# Patient Record
Sex: Female | Born: 1995 | Race: Black or African American | Hispanic: No | Marital: Single
Health system: Southern US, Community
[De-identification: ages and names within clinical notes are randomized; demographics above are authoritative.]

## PROBLEM LIST (undated history)

## (undated) DIAGNOSIS — N809 Endometriosis, unspecified: Secondary | ICD-10-CM

## (undated) HISTORY — PX: WISDOM TOOTH EXTRACTION: SHX21

## (undated) HISTORY — DX: Endometriosis, unspecified: N80.9

---

## 2017-03-23 DIAGNOSIS — J209 Acute bronchitis, unspecified: Secondary | ICD-10-CM | POA: Diagnosis not present

## 2018-02-09 DIAGNOSIS — M545 Low back pain: Secondary | ICD-10-CM | POA: Diagnosis not present

## 2018-02-09 DIAGNOSIS — G441 Vascular headache, not elsewhere classified: Secondary | ICD-10-CM | POA: Diagnosis not present

## 2018-02-09 DIAGNOSIS — N946 Dysmenorrhea, unspecified: Secondary | ICD-10-CM | POA: Diagnosis not present

## 2018-04-12 DIAGNOSIS — J209 Acute bronchitis, unspecified: Secondary | ICD-10-CM | POA: Diagnosis not present

## 2019-02-09 DIAGNOSIS — R5383 Other fatigue: Secondary | ICD-10-CM | POA: Diagnosis not present

## 2019-02-09 DIAGNOSIS — Z0000 Encounter for general adult medical examination without abnormal findings: Secondary | ICD-10-CM | POA: Diagnosis not present

## 2019-02-09 DIAGNOSIS — J029 Acute pharyngitis, unspecified: Secondary | ICD-10-CM | POA: Diagnosis not present

## 2019-03-12 DIAGNOSIS — J029 Acute pharyngitis, unspecified: Secondary | ICD-10-CM | POA: Diagnosis not present

## 2019-04-14 ENCOUNTER — Emergency Department (HOSPITAL_COMMUNITY)
Admission: EM | Admit: 2019-04-14 | Discharge: 2019-04-14 | Disposition: A | Discharge status: Home / Self Care | Payer: 59 | Attending: Emergency Medicine | Admitting: Emergency Medicine

## 2019-04-14 ENCOUNTER — Emergency Department (HOSPITAL_COMMUNITY): Payer: 59

## 2019-04-14 ENCOUNTER — Other Ambulatory Visit: Payer: Self-pay

## 2019-04-14 ENCOUNTER — Encounter (HOSPITAL_COMMUNITY): Payer: Self-pay | Admitting: Radiology

## 2019-04-14 DIAGNOSIS — J029 Acute pharyngitis, unspecified: Secondary | ICD-10-CM | POA: Diagnosis present

## 2019-04-14 DIAGNOSIS — J36 Peritonsillar abscess: Secondary | ICD-10-CM

## 2019-04-14 DIAGNOSIS — J020 Streptococcal pharyngitis: Secondary | ICD-10-CM | POA: Diagnosis not present

## 2019-04-14 DIAGNOSIS — L0291 Cutaneous abscess, unspecified: Secondary | ICD-10-CM

## 2019-04-14 LAB — BASIC METABOLIC PANEL
Anion gap: 9 (ref 5–15)
BUN: 5 mg/dL — ABNORMAL LOW (ref 6–20)
CO2: 23 mmol/L (ref 22–32)
Calcium: 8.9 mg/dL (ref 8.9–10.3)
Chloride: 107 mmol/L (ref 98–111)
Creatinine, Ser: 0.74 mg/dL (ref 0.44–1.00)
GFR calc Af Amer: >60 mL/min (ref >60)
GFR calc non Af Amer: >60 mL/min (ref >60)
Glucose, Bld: 97 mg/dL (ref 70–99)
Potassium: 3.5 mmol/L (ref 3.5–5.1)
Sodium: 139 mmol/L (ref 135–145)

## 2019-04-14 LAB — CBC WITH DIFFERENTIAL/PLATELET
Abs Immature Granulocytes: 0.02 10*3/uL (ref 0.00–0.07)
Basophils Absolute: 0.1 10*3/uL (ref 0.0–0.1)
Basophils Relative: 1 %
Eosinophils Absolute: 0.1 10*3/uL (ref 0.0–0.5)
Eosinophils Relative: 1 %
HCT: 36.5 % (ref 36.0–46.0)
Hemoglobin: 11.6 g/dL — ABNORMAL LOW (ref 12.0–15.0)
Immature Granulocytes: 0 %
Lymphocytes Relative: 11 %
Lymphs Abs: 1.1 10*3/uL (ref 0.7–4.0)
MCH: 26.6 pg (ref 26.0–34.0)
MCHC: 31.8 g/dL (ref 30.0–36.0)
MCV: 83.7 fL (ref 80.0–100.0)
Monocytes Absolute: 0.7 10*3/uL (ref 0.1–1.0)
Monocytes Relative: 8 %
Neutro Abs: 7.5 10*3/uL (ref 1.7–7.7)
Neutrophils Relative %: 79 %
Platelets: 324 10*3/uL (ref 150–400)
RBC: 4.36 MIL/uL (ref 3.87–5.11)
RDW: 13.2 % (ref 11.5–15.5)
WBC: 9.4 10*3/uL (ref 4.0–10.5)
nRBC: 0 % (ref 0.0–0.2)

## 2019-04-14 LAB — I-STAT BETA HCG BLOOD, ED (MC, WL, AP ONLY): I-stat hCG, quantitative: <5 m[IU]/mL (ref <5)

## 2019-04-14 IMAGING — CT CT NECK WITH CONTRAST
4 of 5 series · 15 of 33 positions shown, 17 images · IV contrast (omnipaque)
Comparison: None.

CLINICAL DATA: Sore throat for 4 weeks. Recent diagnosis of strep
throat without improvement on antibiotics.

EXAM:
CT NECK WITH CONTRAST
TECHNIQUE: Multidetector CT imaging of the neck was performed using the
standard protocol following the bolus administration of intravenous
contrast.
CONTRAST:  75mL OMNIPAQUE IOHEXOL 300 MG/ML  SOLN

[Series 3: neck 2.0 (person_name) (person_name) · axial · 0.52mm/px · z∈[-240,-114]mm · 4 of 107 slices shown, 5 images]
[im 22/107  soft-tissue]
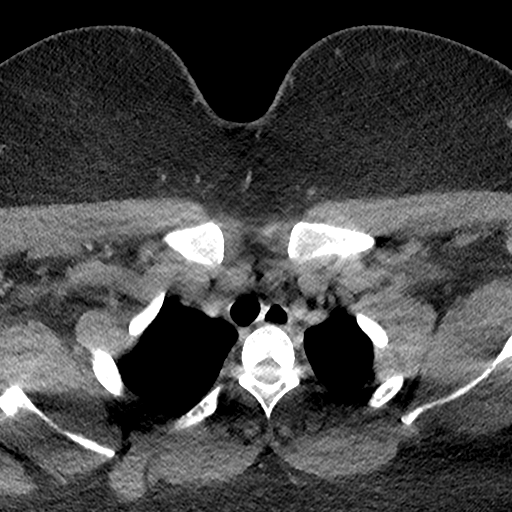
[im 22/107  bone]
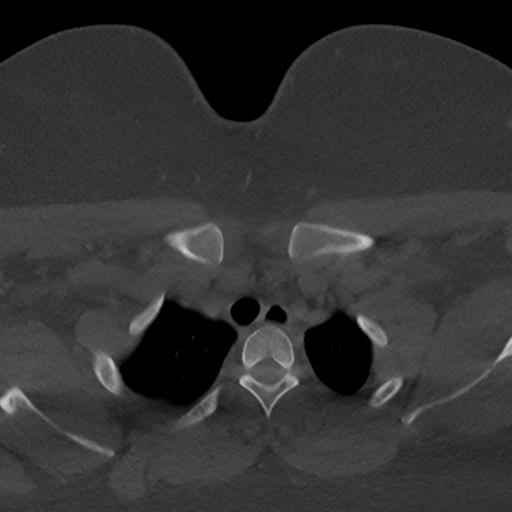
[im 43/107  bone]
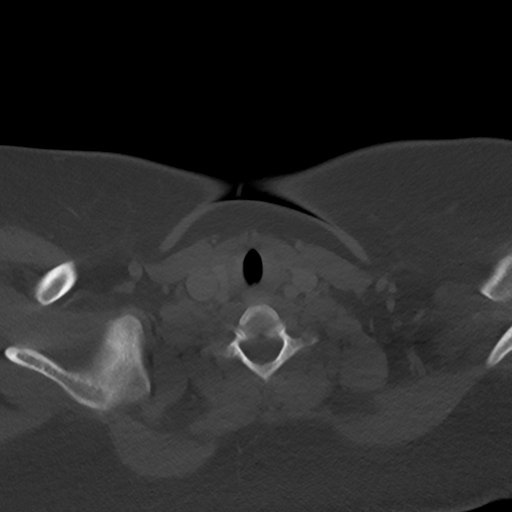
[im 64/107  bone]
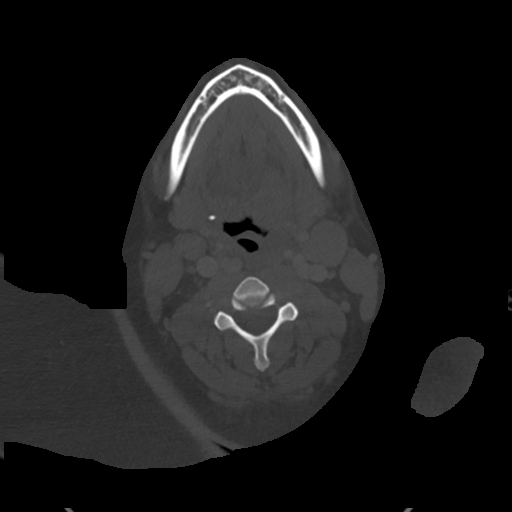
[im 85/107  bone]
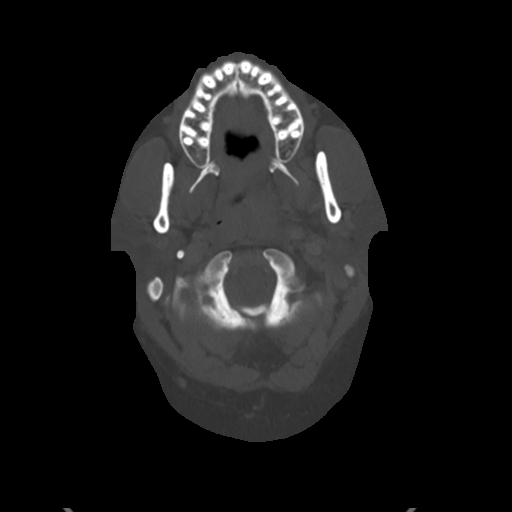

[Series 5: neck 2.0 st · sagittal · 0.45mm/px · 5 of 101 slices shown, 6 images (1 of 2)]
[im 34/101  bone]
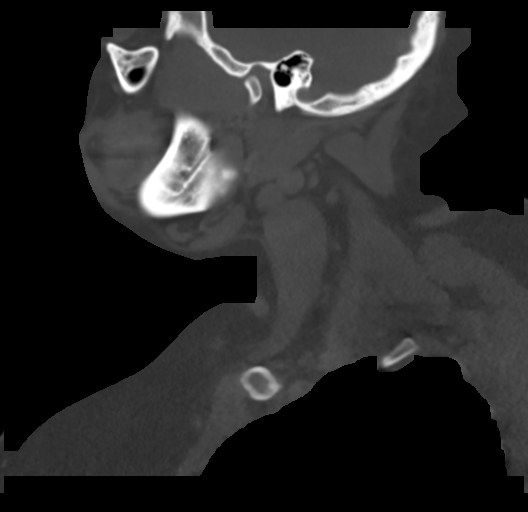
[im 42/101  bone]
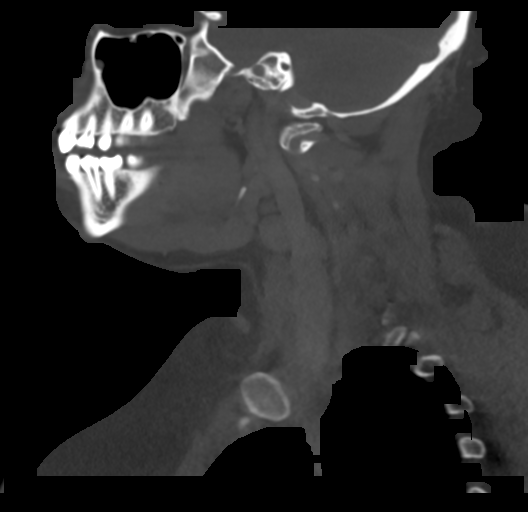
[im 51/101  soft-tissue]
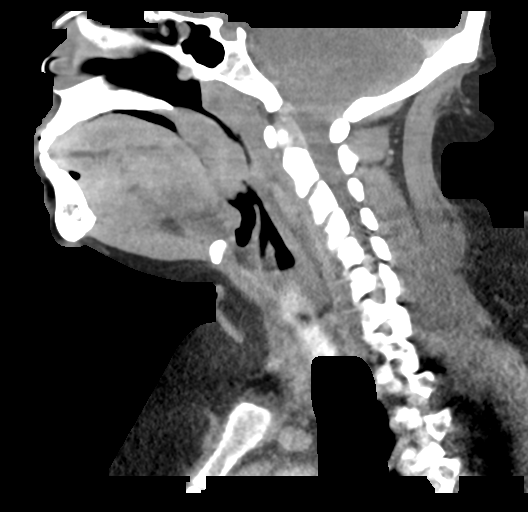
[im 51/101  bone]
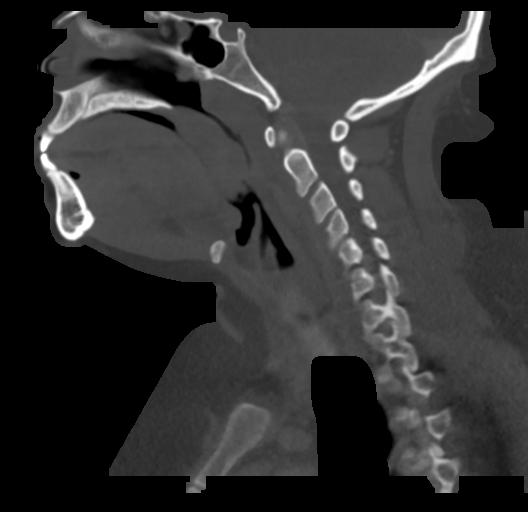
[im 59/101  bone]
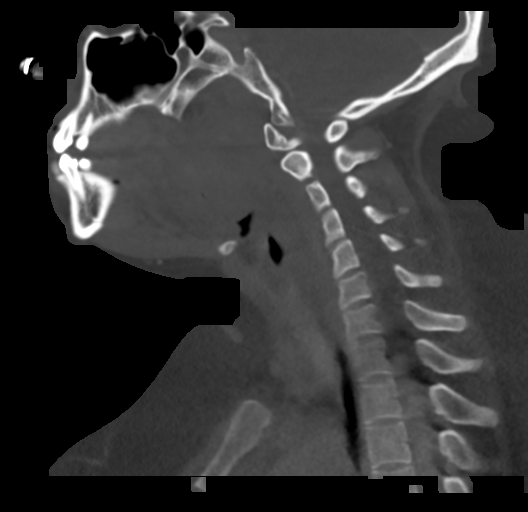
[im 67/101  bone]
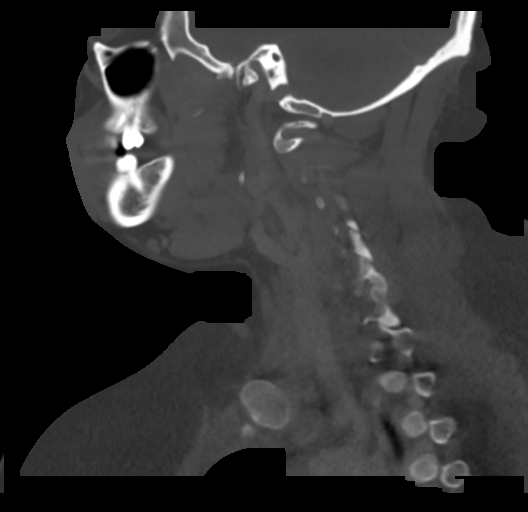

[Series 6: neck 2.0 st · coronal · 0.37mm/px · 3 of 109 slices shown (2 of 2)]
[im 22/109  bone]
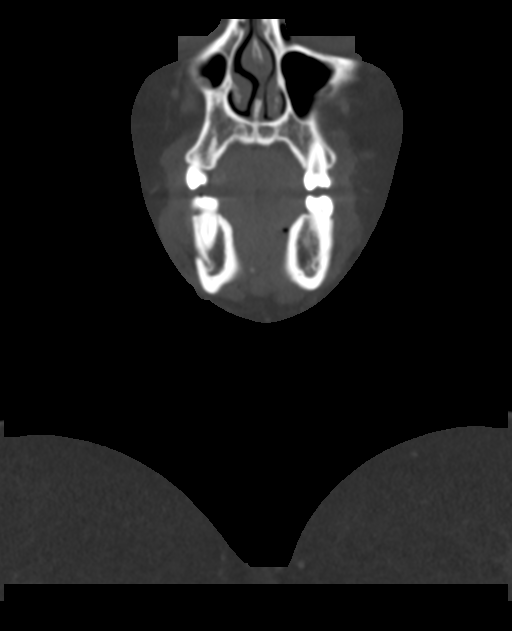
[im 44/109  bone]
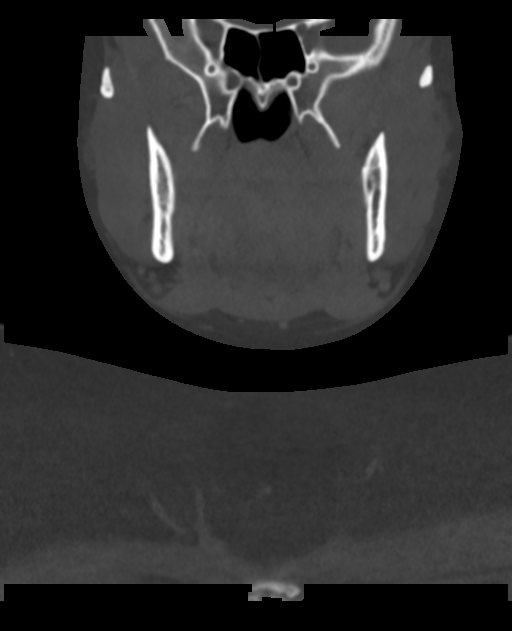
[im 65/109  bone]
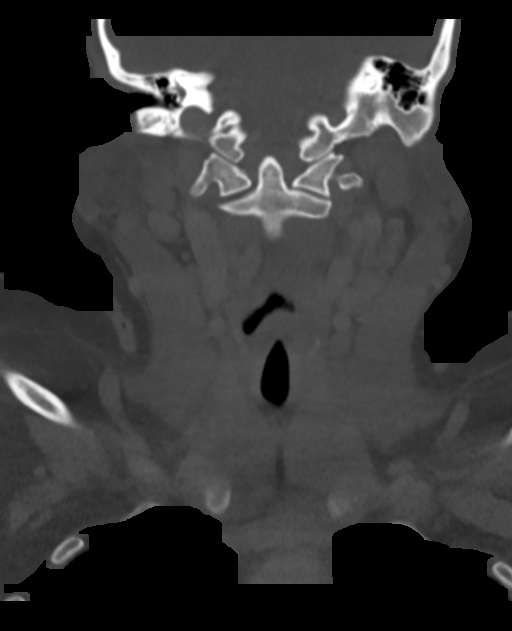

[Series 7: neck 2.0 st orthogonal · axial · 0.39mm/px · z∈[-240,-156]mm · 3 of 106 slices shown]
[im 22/106  bone]
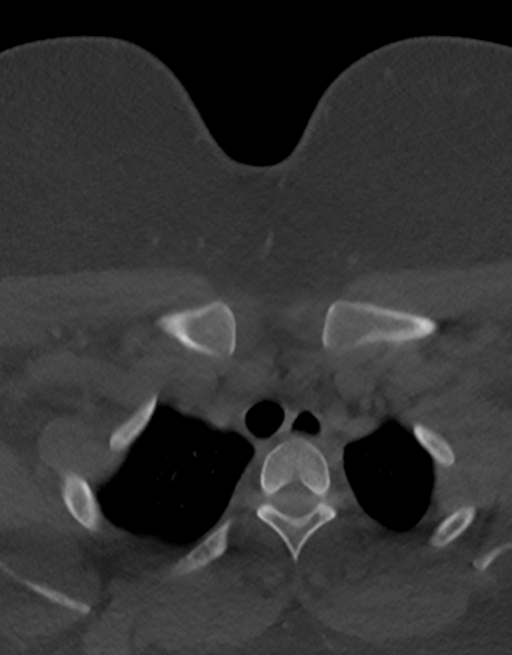
[im 43/106  bone]
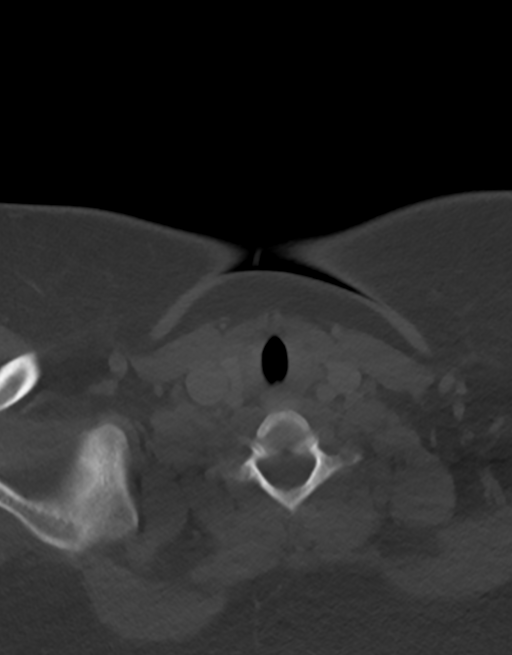
[im 64/106  bone]
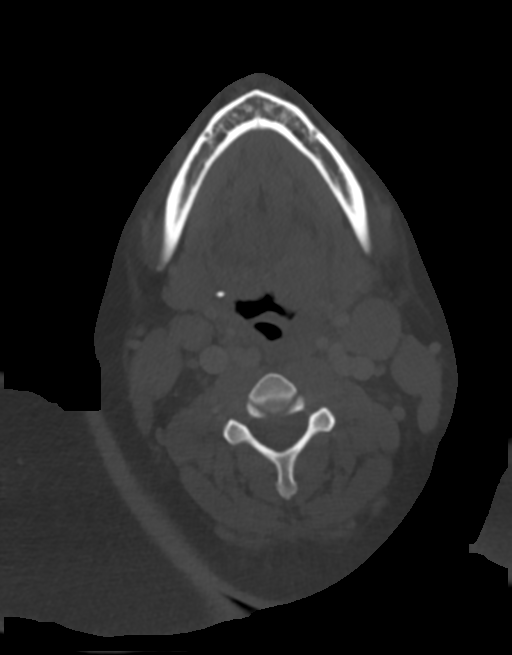

[15 of 33 positions shown; findings below may reference images not displayed]

FINDINGS: Pharynx and larynx: Palatine tonsils are enlarged bilaterally, left
greater than right. There is moderate prominence of the lingual
tonsils as well. An ill-defined area of hypoattenuation is present
within or adjacent to the left palatine tonsil measuring 1.1 x 2.0 x
1.8 cm. Adenoid tissue is enlarged without focal abscess. There is
some narrowing of the nasopharyngeal airway. Hypopharynx is clear.
Vocal cords are midline and symmetric. Trachea is unremarkable.

Salivary glands: Submandibular and parotid glands are within normal
limits bilaterally.

Thyroid: Normal

Lymph nodes: Enlarged cervical lymph nodes are present bilaterally.
These are likely reactive.

Vascular: No significant vascular disease is present.

Limited intracranial: Within normal limits

Visualized orbits: The globes and orbits are within normal limits.

Mastoids and visualized paranasal sinuses: The paranasal sinuses and
mastoid air cells are clear.

Skeleton: Vertebral body heights alignment are maintained. No focal
lytic or blastic lesions are present.

Upper chest: The lung apices are clear. Thoracic inlet is within
normal limits.
IMPRESSION: 1. Enlarged palatine tonsils and adenoid tissue compatible with
acute pharyngitis.
2. Hypodense area within the left palatine tonsil measuring 1.1 x
2.0 x 1.8 cm likely represents phlegmon with possible developing
abscess. No discrete drainable abscess is present at this time.
3. Reactive cervical adenopathy, left greater than right.

## 2019-04-14 MED ORDER — SODIUM CHLORIDE 0.9 % IV BOLUS
1000.0000 mL | Freq: Once | INTRAVENOUS | Status: AC
  Administered 2019-04-14: 12:00:00 1000 mL via INTRAVENOUS

## 2019-04-14 MED ORDER — DEXAMETHASONE SODIUM PHOSPHATE 10 MG/ML IJ SOLN
10.0000 mg | Freq: Once | INTRAMUSCULAR | Status: AC
  Administered 2019-04-14: 12:00:00 10 mg via INTRAVENOUS
  Filled 2019-04-14: qty 1

## 2019-04-14 MED ORDER — IOHEXOL 300 MG/ML  SOLN
75.0000 mL | Freq: Once | INTRAMUSCULAR | Status: AC | PRN
  Administered 2019-04-14: 75 mL via INTRAVENOUS

## 2019-04-14 MED ORDER — HYDROCODONE-ACETAMINOPHEN 5-325 MG PO TABS: 2.0000 | ORAL_TABLET | ORAL | 0 refills | Status: DC | PRN

## 2019-04-14 MED ORDER — CLINDAMYCIN PHOSPHATE 600 MG/50ML IV SOLN
600.0000 mg | Freq: Once | INTRAVENOUS | Status: AC
  Administered 2019-04-14: 600 mg via INTRAVENOUS
  Filled 2019-04-14: qty 50

## 2019-04-14 MED ORDER — KETOROLAC TROMETHAMINE 15 MG/ML IJ SOLN
15.0000 mg | Freq: Once | INTRAMUSCULAR | Status: AC
  Administered 2019-04-14: 15 mg via INTRAVENOUS
  Filled 2019-04-14: qty 1

## 2019-04-14 MED ORDER — CLINDAMYCIN HCL 300 MG PO CAPS: 300.0000 mg | ORAL_CAPSULE | Freq: Three times a day (TID) | ORAL | 0 refills | Status: AC

## 2019-04-14 NOTE — ED Provider Notes (Signed)
[facility] EMERGENCY DEPARTMENT Provider Note   CSN: [id number] Arrival date & time: 04/14/19  1053  History   Chief Complaint Chief Complaint  Patient presents with   Sore Throat    HPI Patricia Henderson is a 23 y.o. female with past medical history significant for strep pharyngitis presents for evaluation of sore throat.  Patient evaluated on Monday and diagnosed with strep pharyngitis by PCP.  Was given azithromycin.  Patient completed antibiotics yesterday 04/13/2019.  Patient presented to urgent care today with worsening sore throat and left-sided.  There was concern for PTA and was sent to ED for evaluation. Pain is rated a 8/10. Pain is located to bilateral sides of throat however worse on left. Denies fever, chill, drooling , dysphagia, trismus, neck pain, neck stiffness, facial swelling, rhinorrhea, HA, ear pain, oral swelling, CP, SOB, ABD pain, dysuria. Denies previous hx of PTA or RPA. Has not taken anything for her symptoms. Has been able to tolerate PO liquids however has pain with swallowing solid foods. Denies additional aggravating or alleviating factors. Symptoms constant since onset.  History obtained from patient. No interpretor as used.  No UR Sx or exposures to COVID positive patients.     HPI  History reviewed. No pertinent past medical history.  There are no active problems to display for this patient.   History reviewed   OB History   No obstetric history on file.      Home Medications    Prior to Admission medications   Medication Sig Start Date End Date Taking? Authorizing Provider  clindamycin (CLEOCIN) 300 MG capsule Take 1 capsule (300 mg total) by mouth 3 (three) times daily for 5 days. 04/14/19 04/19/19  Yobany Vroom A, PA-C    Family History No family history on file.  Social History Social History   Tobacco Use   Smoking status: Not on file  Substance Use Topics   Alcohol use: Not on file   Drug use: Not on  file     Allergies   Suprax [cefixime]   Review of Systems Review of Systems  Constitutional: Negative.   HENT: Positive for sore throat. Negative for congestion, dental problem, drooling, ear discharge, ear pain, facial swelling, mouth sores, postnasal drip, rhinorrhea, sinus pressure, sinus pain, sneezing, tinnitus, trouble swallowing and voice change.   Eyes: Negative.   Respiratory: Negative.   Cardiovascular: Negative.   Gastrointestinal: Negative.   Genitourinary: Negative.   Musculoskeletal: Negative.   Skin: Negative.   Neurological: Negative.   All other systems reviewed and are negative.    Physical Exam Updated Vital Signs BP 134/79 (BP Location: Right Arm)    Pulse 80    Temp 98.6 F (37 C) (Oral)    Resp 18    Ht 5\' 5"  (1.651 m)    Wt 119.3 kg    SpO2 99%    BMI 43.77 kg/m   Physical Exam Vitals signs and nursing note reviewed.  Constitutional:      General: She is not in acute distress.    Appearance: She is well-developed. She is not ill-appearing, toxic-appearing or diaphoretic.     Comments: Texting on phone on initial evaluation. No acute distress noted.  HENT:     Head: Normocephalic and atraumatic.     Comments: No facial swelling.  No submandibular swelling.  Jaw occlusion normal.  No trismus.  Salivary glands without tenderness or enlarged.    Right Ear: Tympanic membrane and ear canal normal. No drainage, swelling  or tenderness. No middle ear effusion. Tympanic membrane is not perforated, erythematous or retracted.     Left Ear: Tympanic membrane and ear canal normal. No drainage, swelling or tenderness.  No middle ear effusion. Tympanic membrane is not perforated, erythematous or retracted.     Nose: Nose normal. No congestion or rhinorrhea.     Mouth/Throat:     Lips: Pink.     Mouth: Mucous membranes are moist.     Dentition: No gingival swelling.     Pharynx: Oropharynx is clear. Uvula midline.     Comments: Mucous membranes moist.  No pooling  of secretions.  Uvula midline without deviation.  She does have some mildly asymmetric tonsils, left greater than right at 2+, right 1+.  Minimal exudate to bilateral tonsils.  No uvula swelling or exudate.  Sublingual area soft.  Her mucous membranes are moist.  She has no oral lesions. Eyes:     Pupils: Pupils are equal, round, and reactive to light.  Neck:     Musculoskeletal: Full passive range of motion without pain and normal range of motion.     Trachea: Phonation normal.     Comments: Neck stiffness or neck rigidity.  No meningismus.  She does have mild cervical lymphadenopathy.  No drooling, dysphasia or trismus.  Phonation normal. Cardiovascular:     Rate and Rhythm: Normal rate.     Pulses: Normal pulses.     Heart sounds: Normal heart sounds.  Pulmonary:     Effort: No respiratory distress.     Comments: Clear to auscultation bilaterally without wheeze, rhonchi or rales.  No accessory muscle usage.  No stridor.  Speaking full sentences without difficulty. Abdominal:     General: There is no distension.     Comments: Soft, nontender without rebound or guarding.  She has no splenomegaly.  Musculoskeletal: Normal range of motion.     Comments: Moves all 4 extremities without difficulty.  No lower extremity edema, erythema, ecchymosis or warmth.  Calves nontender bilaterally.  2+ DP pulses  Lymphadenopathy:     Cervical: Cervical adenopathy present.  Skin:    General: Skin is warm and dry.     Comments: Brisk capillary refill.  No rashes, lesions, vesicles or bulla.  Neurological:     Mental Status: She is alert.     Comments: Cranial nerves II through XII grossly intact.  Phonation normal.  No dysphasia.  No facial droop.  Patient ambulatory in department without difficulty.    ED Treatments / Results  Labs (all labs ordered are listed, but only abnormal results are displayed) Labs Reviewed  CBC WITH DIFFERENTIAL/PLATELET - Abnormal; Notable for the following components:       Result Value   Hemoglobin 11.6 (*)    All other components within normal limits  BASIC METABOLIC PANEL - Abnormal; Notable for the following components:   BUN 5 (*)    All other components within normal limits  I-STAT BETA HCG BLOOD, ED ([facility], [facility], [facility] ONLY)    EKG None  Radiology Ct Soft Tissue Neck W Contrast  Result Date: 04/14/2019 CLINICAL DATA:  Sore throat for 4 weeks. Recent diagnosis of strep throat without improvement on antibiotics. EXAM: CT NECK WITH CONTRAST TECHNIQUE: Multidetector CT imaging of the neck was performed using the standard protocol following the bolus administration of intravenous contrast. CONTRAST:  75mL OMNIPAQUE IOHEXOL 300 MG/ML  SOLN COMPARISON:  None. FINDINGS: Pharynx and larynx: Palatine tonsils are enlarged bilaterally, left greater than right. There  is moderate prominence of the lingual tonsils as well. An ill-defined area of hypoattenuation is present within or adjacent to the left palatine tonsil measuring 1.1 x 2.0 x 1.8 cm. Adenoid tissue is enlarged without focal abscess. There is some narrowing of the nasopharyngeal airway. Hypopharynx is clear. Vocal cords are midline and symmetric. Trachea is unremarkable. Salivary glands: Submandibular and parotid glands are within normal limits bilaterally. Thyroid: Normal Lymph nodes: Enlarged cervical lymph nodes are present bilaterally. These are likely reactive. Vascular: No significant vascular disease is present. Limited intracranial: Within normal limits Visualized orbits: The globes and orbits are within normal limits. Mastoids and visualized paranasal sinuses: The paranasal sinuses and mastoid air cells are clear. Skeleton: Vertebral body heights alignment are maintained. No focal lytic or blastic lesions are present. Upper chest: The lung apices are clear. Thoracic inlet is within normal limits. IMPRESSION: 1. Enlarged palatine tonsils and adenoid tissue compatible with acute pharyngitis. 2. Hypodense area  within the left palatine tonsil measuring 1.1 x 2.0 x 1.8 cm likely represents phlegmon with possible developing abscess. No discrete drainable abscess is present at this time. 3. Reactive cervical adenopathy, left greater than right. Electronically Signed   By: Marin Robertshristopher  Mattern M.D.   On: 04/14/2019 13:53    Procedures Procedures (including critical care time)  Medications Ordered in ED Medications  sodium chloride 0.9 % bolus 1,000 mL (1,000 mLs Intravenous New Bag/Given 04/14/19 1135)  clindamycin (CLEOCIN) IVPB 600 mg (0 mg Intravenous Stopped 04/14/19 1205)  ketorolac (TORADOL) 15 MG/ML injection 15 mg (15 mg Intravenous Given 04/14/19 1146)  dexamethasone (DECADRON) injection 10 mg (10 mg Intravenous Given 04/14/19 1146)  iohexol (OMNIPAQUE) 300 MG/ML solution 75 mL (75 mLs Intravenous Contrast Given 04/14/19 1254)     Initial Impression / Assessment and Plan / ED Course  I have reviewed the triage vital signs and the nursing notes.  Pertinent labs & imaging results that were available during my care of the patient were reviewed by me and considered in my medical decision making (see chart for details).  23 year old female appears otherwise well presents for evaluation of sore throat.  She is afebrile, nonseptic, non-ill-appearing.  Diagnosed with strep pharyngitis on Monday and recently completed prescription for azithromycin.  Seen by urgent care at [facility] today for worsening sore throat on left greater than right.  There was concern for possible peritonsillar abscess and was sent to ED for evaluation.  She has had no drooling, dysphasia or trismus.  Her phonation is normal.  She has no pooling of secretions.  She does have asymmetric tonsillar edema, 2+ on left, 1+ on right.  She has very minimal tonsillar exudate.  Her uvula is midline without deviation.  Mucous membranes are moist.  She has no evidence of respiratory distress.  Given concern for peritonsillar abscess will  obtain CT soft tissue neck to rule out PTA/RPA.  We will also give IVF and ABX for known strep infection. Strep pharyngitis positive by culture at [facility] PCP. Do not feel we need to repeat this at this time.  Personally reviewed notes and labs from UC and PCP that patient brought with her.  Labs-- CBC without leukocytosis, BMP without electrolyte renal or liver abnormality, HCG negative Imaging: CT neck-- Enlarged palatine tonsils and adenoid tissue compatible with acute pharyngitis. Hypodense area within the left palatine tonsil measuring 1.1 x 2.0 x 1.8 cm likely represents phlegmon with possible developing abscess. No discrete drainable abscess is present at this time. Reactive cervical adenopathy,  left greater than right. Meds--Clindamycin, Toradol, Decadron, IVF  1400: [facility]-- CT with Developing phlegmon with possible developing abscess which is not large enough to drain at this time, per Radiology. Patient given IV Clindamycin. Will dc home with PO Clindamycin and have patient follow up with ENT on Monday. Patient able to tolerate PO without difficulty, No evidence of respiratory distress, airway patent. Pain significant improvement with meds in ED. No drooling, dysphasia or trismus on reevluation.  She has no pooling of secretions.  Phonation without changes. Airway remain patent.  Patient is hemodynamically stable and in no acute distress.  Patient able to ambulate in department prior to ED.  Evaluation does not show acute pathology that would require ongoing or additional emergent interventions while in the emergency department or further inpatient treatment.  I have discussed the diagnosis with the patient and answered all questions.  Pain is been managed while in the emergency department and patient has no further complaints prior to discharge.  Patient is comfortable with plan discussed in room and is stable for discharge at this time.  I have discussed strict return precautions for returning to  the emergency department.  Patient was encouraged to follow-up with specialist referred to at discharge.     Patricia Henderson was evaluated in Emergency Department on 04/14/2019 for the symptoms described in the history of present illness. She was evaluated in the context of the global COVID-19 pandemic, which necessitated consideration that the patient might be at risk for infection with the SARS-CoV-2 virus that causes COVID-19. Institutional protocols and algorithms that pertain to the evaluation of patients at risk for COVID-19 are in a state of rapid change based on information released by regulatory bodies including the CDC and federal and state organizations. These policies and algorithms were followed during the patient's care in the ED. Final Clinical Impressions(s) / ED Diagnoses   Final diagnoses:  Pharyngitis due to Streptococcus species  Phlegmon  Peritonsillar abscess    ED Discharge Orders         Ordered    clindamycin (CLEOCIN) 300 MG capsule  3 times daily     04/14/19 1405           Santiana Glidden A, PA-C 04/14/19 1411    Tegeler, Canary Brim, MD 04/14/19 [phone]

## 2019-04-14 NOTE — ED Notes (Signed)
Patient verbalizes understanding of discharge instructions. Opportunity for questioning and answering were provided.  patient discharged from ED.  

## 2019-04-14 NOTE — Discharge Instructions (Signed)
Your CT scan showed a possible developing peritonsillar abscess. This is not large enough to drain at this time, per the Radiologist. Please follow up with ENT. Their contact information is listed on your discharge paperwork.  Call them on Monday.  Also given you antibiotics.  Please take as prescribed.  If you develop inability to swollow,

## 2019-04-14 NOTE — ED Triage Notes (Signed)
Pt c/o throat pain and swelling to the left tonsils , pt states she has been diagnosed with strep throat and was given abx , but still hasn't had much relief , pt states she is having difficultly swallowing ; denies any sob

## 2019-06-18 ENCOUNTER — Other Ambulatory Visit: Payer: Self-pay | Admitting: Obstetrics and Gynecology

## 2019-06-18 ENCOUNTER — Other Ambulatory Visit (HOSPITAL_COMMUNITY)
Admission: RE | Admit: 2019-06-18 | Discharge: 2019-06-18 | Disposition: A | Discharge status: Home / Self Care | Payer: 59 | Source: Ambulatory Visit | Attending: Obstetrics and Gynecology | Admitting: Obstetrics and Gynecology

## 2019-06-18 DIAGNOSIS — Z01419 Encounter for gynecological examination (general) (routine) without abnormal findings: Secondary | ICD-10-CM | POA: Insufficient documentation

## 2019-06-19 LAB — CYTOLOGY - PAP: Diagnosis: NEGATIVE

## 2020-03-15 NOTE — Progress Notes (Signed)
 [place] Department of Health notified of results per regulations

## 2021-03-04 ENCOUNTER — Other Ambulatory Visit: Payer: Self-pay | Admitting: Orthopedic Surgery

## 2021-03-04 DIAGNOSIS — M5416 Radiculopathy, lumbar region: Secondary | ICD-10-CM

## 2021-03-04 DIAGNOSIS — M545 Low back pain, unspecified: Secondary | ICD-10-CM

## 2021-03-08 ENCOUNTER — Ambulatory Visit
Admission: RE | Admit: 2021-03-08 | Discharge: 2021-03-08 | Disposition: A | Discharge status: Home / Self Care | Payer: 59 | Source: Ambulatory Visit | Attending: Orthopedic Surgery | Admitting: Orthopedic Surgery

## 2021-03-08 ENCOUNTER — Other Ambulatory Visit: Payer: Self-pay

## 2021-03-08 DIAGNOSIS — M5416 Radiculopathy, lumbar region: Secondary | ICD-10-CM

## 2021-03-08 DIAGNOSIS — M545 Low back pain, unspecified: Secondary | ICD-10-CM
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
## 2021-05-28 ENCOUNTER — Other Ambulatory Visit: Payer: Self-pay | Admitting: Family Medicine

## 2021-05-28 ENCOUNTER — Other Ambulatory Visit: Payer: Self-pay

## 2021-05-28 ENCOUNTER — Ambulatory Visit
Admission: RE | Admit: 2021-05-28 | Discharge: 2021-05-28 | Disposition: A | Discharge status: Home / Self Care | Payer: 59 | Source: Ambulatory Visit | Attending: Family Medicine | Admitting: Family Medicine

## 2021-05-28 DIAGNOSIS — R059 Cough, unspecified: Secondary | ICD-10-CM

## 2021-05-28 IMAGING — CR DG CHEST 2V
2 series · 2 of 2 positions shown · non-contrast
Comparison: None.

CLINICAL DATA: 24-year-old female with cough

EXAM:
CHEST - 2 VIEW

[w chest pa]
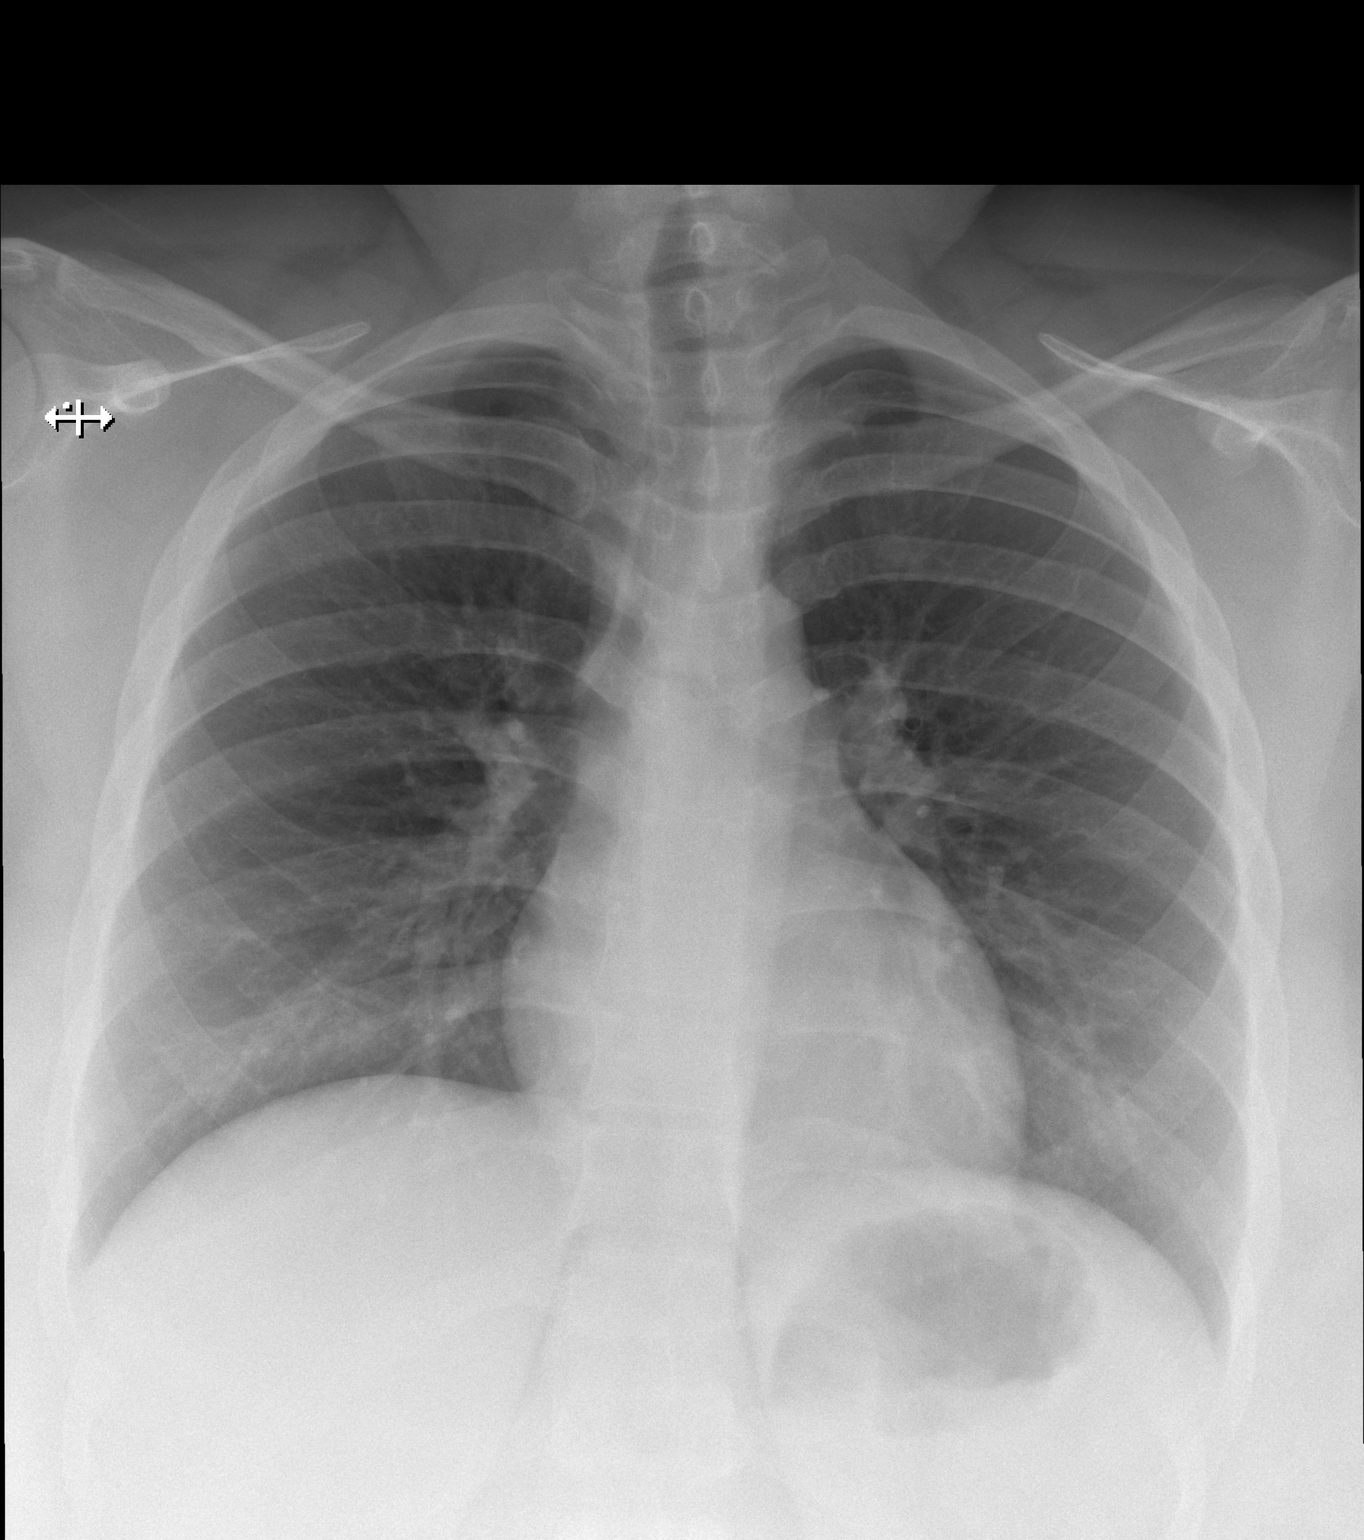

[w chest lat]
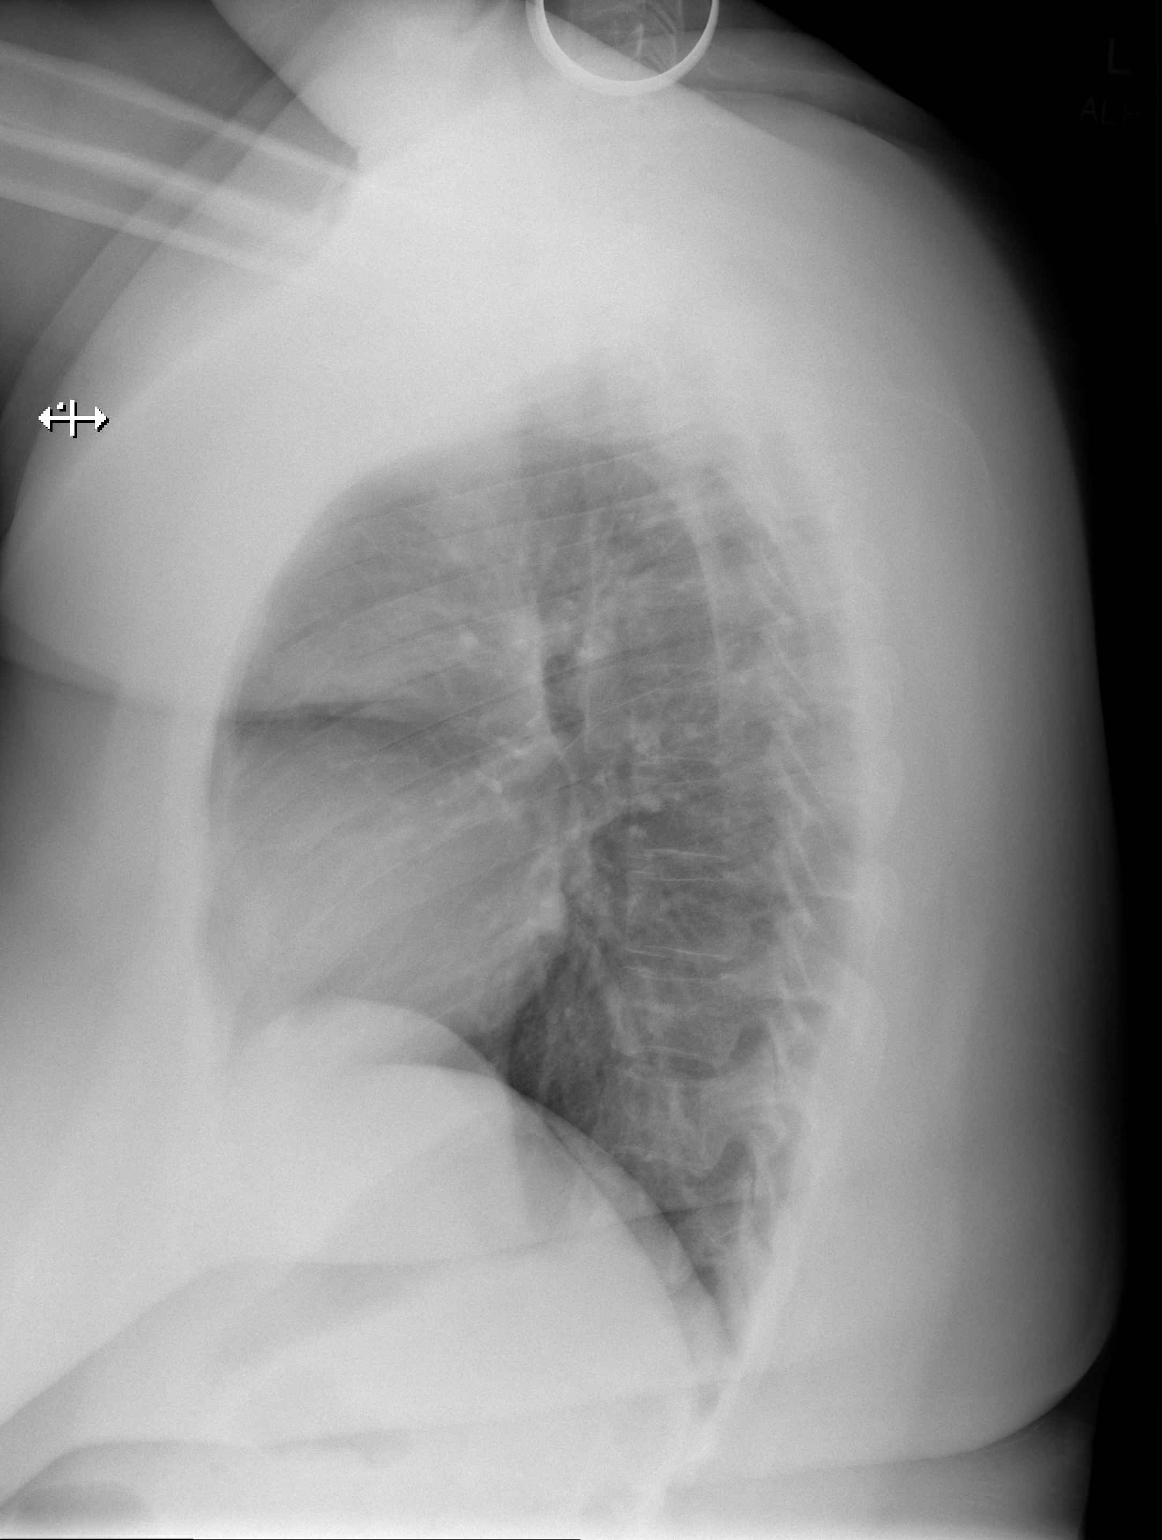

[2 of 2 positions shown; findings below may reference images not displayed]

FINDINGS: The heart size and mediastinal contours are within normal limits.
Both lungs are clear. The visualized skeletal structures are
unremarkable.
IMPRESSION: No active cardiopulmonary disease.

## 2021-09-16 ENCOUNTER — Other Ambulatory Visit: Payer: Self-pay

## 2021-09-16 ENCOUNTER — Encounter: Payer: Self-pay | Admitting: Internal Medicine

## 2021-09-16 ENCOUNTER — Ambulatory Visit: Payer: 59 | Admitting: Internal Medicine

## 2021-09-16 VITALS — BP 100/70 | HR 124 | Temp 98.7°F | Ht 64.5 in | Wt 307.0 lb

## 2021-09-16 DIAGNOSIS — J310 Chronic rhinitis: Secondary | ICD-10-CM

## 2021-09-16 DIAGNOSIS — R053 Chronic cough: Secondary | ICD-10-CM

## 2021-09-16 DIAGNOSIS — R0602 Shortness of breath: Secondary | ICD-10-CM | POA: Diagnosis not present

## 2021-09-16 MED ORDER — FLUTICASONE PROPIONATE 50 MCG/ACT NA SUSP: 1.0000 | Freq: Every day | NASAL | 2 refills | Status: DC

## 2021-09-16 MED ORDER — PANTOPRAZOLE SODIUM 40 MG PO TBEC: 40.0000 mg | DELAYED_RELEASE_TABLET | Freq: Every day | ORAL | 3 refills | Status: AC

## 2021-09-16 NOTE — Patient Instructions (Addendum)
Please schedule follow up scheduled with myself in 2 months.    Before your next visit I would like you to have:  Full set of PFTs - 45 minutes (next available)  Flonase - 1 spray on each side of your nose twice a day for first week, then 1 spray on each side.   Instructions for use: If you also use a saline nasal spray or rinse, use that first. Position the head with the chin slightly tucked. Use the right hand to spray into the left nostril and the right hand to spray into the left nostril.   Point the bottle away from the septum of your nose (cartilage that divides the two sides of your nose).  Hold the nostril closed on the opposite side from where you will spray Spray once and gently sniff to pull the medicine into the higher parts of your nose.  Don't sniff too hard as the medicine will drain down the back of your throat instead. Repeat with a second spray on the same side if prescribed. Repeat on the other side of your nose.    What is GERD? Gastroesophageal reflux disease (GERD) is gastroesophageal reflux diseasewhich occurs when the lower esophageal sphincter (LES) opens spontaneously, for varying periods of time, or does not close properly and stomach contents rise up into the esophagus. GER is also called acid reflux or acid regurgitation, because digestive juices--called acids--rise up with the food. The esophagus is the tube that carries food from the mouth to the stomach. The LES is a ring of muscle at the bottom of the esophagus that acts like a valve between the esophagus and stomach.  When acid reflux occurs, food or fluid can be tasted in the back of the mouth. When refluxed stomach acid touches the lining of the esophagus it may cause a burning sensation in the chest or throat called heartburn or acid indigestion. Occasional reflux is common. Persistent reflux that occurs more than twice a week is considered GERD, and it can eventually lead to more serious health problems.  People of all ages can have GERD. Studies have shown that GERD may worsen or contribute to asthma, chronic cough, and pulmonary fibrosis.   What are the symptoms of GERD? The main symptom of GERD in adults is frequent heartburn, also called acid indigestion--burning-type pain in the lower part of the mid-chest, behind the breast bone, and in the mid-abdomen.  Not all reflux is acidic in nature, and many patients don't have heart burn at all. Sometimes it feels like a cough (either dry or with mucus), choking sensation, asthma, shortness of breath, waking up at night, frequent throat clearing, or trouble swallowing.    What causes GERD? The reason some people develop GERD is still unclear. However, research shows that in people with GERD, the LES relaxes while the rest of the esophagus is working. Anatomical abnormalities such as a hiatal hernia may also contribute to GERD. A hiatal hernia occurs when the upper part of the stomach and the LES move above the diaphragm, the muscle wall that separates the stomach from the chest. Normally, the diaphragm helps the LES keep acid from rising up into the esophagus. When a hiatal hernia is present, acid reflux can occur more easily. A hiatal hernia can occur in people of any age and is most often a normal finding in otherwise healthy people over age 54. Most of the time, a hiatal hernia produces no symptoms.   Other factors that may contribute  to GERD include - Obesity or recent weight gain - Pregnancy  - Smoking  - Diet - Certain medications  Common foods that can worsen reflux symptoms include: - carbonated beverages - artificial sweeteners - citrus fruits  - chocolate  - drinks with caffeine or alcohol  - fatty and fried foods  - garlic and onions  - mint flavorings  - spicy foods  - tomato-based foods, like spaghetti sauce, salsa, chili, and pizza   Lifestyle Changes If you smoke, stop.  Avoid foods and beverages that worsen symptoms (see  above.) Lose weight if needed.  Eat small, frequent meals.  Wear loose-fitting clothes.  Avoid lying down for 3 hours after a meal.  Raise the head of your bed 6 to 8 inches by securing wood blocks under the bedposts. Just using extra pillows will not help, but using a wedge-shaped pillow may be helpful.  Medications  H2 blockers, such as cimetidine (Tagamet HB), famotidine (Pepcid AC), nizatidine (Axid AR), and ranitidine (Zantac 75), decrease acid production. They are available in prescription strength and over-the-counter strength. These drugs provide short-term relief and are effective for about half of those who have GERD symptoms.  Proton pump inhibitors include omeprazole (Prilosec, Zegerid), lansoprazole (Prevacid), pantoprazole (Protonix), rabeprazole (Aciphex), and esomeprazole (Nexium), which are available by prescription. Prilosec is also available in over-the-counter strength. Proton pump inhibitors are more effective than H2 blockers and can relieve symptoms and heal the esophageal lining in almost everyone who has GERD.  Because drugs work in different ways, combinations of medications may help control symptoms. People who get heartburn after eating may take both antacids and H2 blockers. The antacids work first to neutralize the acid in the stomach, and then the H2 blockers act on acid production. By the time the antacid stops working, the H2 blocker will have stopped acid production. Your health care provider is the best source of information about how to use medications for GERD.   Points to Remember 1. You can have GERD without having heartburn. Your symptoms could include a dry cough, asthma symptoms, or trouble swallowing.  2. Taking medications daily as prescribed is important in controlling you symptoms.  Sometimes it can take up to 8 weeks to fully achieve the effects of the medications prescribed.  3. Coughing related to GERD can be difficult to treat and is very  frustrating!  However, it is important to stick with these medications and lifestyle modifications before pursuing more aggressive or invasive test and treatments.
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
## 2021-11-16 ENCOUNTER — Ambulatory Visit: Payer: 59 | Admitting: Internal Medicine

## 2021-12-12 ENCOUNTER — Other Ambulatory Visit: Payer: Self-pay | Admitting: Internal Medicine

## 2021-12-15 DIAGNOSIS — Z3041 Encounter for surveillance of contraceptive pills: Secondary | ICD-10-CM | POA: Diagnosis not present

## 2022-03-04 ENCOUNTER — Other Ambulatory Visit: Payer: Self-pay | Admitting: Physician Assistant

## 2022-03-04 DIAGNOSIS — R2232 Localized swelling, mass and lump, left upper limb: Secondary | ICD-10-CM

## 2022-03-04 DIAGNOSIS — Z13220 Encounter for screening for lipoid disorders: Secondary | ICD-10-CM | POA: Diagnosis not present

## 2022-03-04 DIAGNOSIS — Z23 Encounter for immunization: Secondary | ICD-10-CM | POA: Diagnosis not present

## 2022-03-04 DIAGNOSIS — F411 Generalized anxiety disorder: Secondary | ICD-10-CM | POA: Diagnosis not present

## 2022-03-04 DIAGNOSIS — Z0000 Encounter for general adult medical examination without abnormal findings: Secondary | ICD-10-CM | POA: Diagnosis not present

## 2022-03-05 ENCOUNTER — Ambulatory Visit
Admission: RE | Admit: 2022-03-05 | Discharge: 2022-03-05 | Disposition: A | Discharge status: Home / Self Care | Payer: BC Managed Care – PPO | Source: Ambulatory Visit | Attending: Physician Assistant | Admitting: Physician Assistant

## 2022-03-05 DIAGNOSIS — R2232 Localized swelling, mass and lump, left upper limb: Secondary | ICD-10-CM

## 2022-03-05 DIAGNOSIS — M7989 Other specified soft tissue disorders: Secondary | ICD-10-CM | POA: Diagnosis not present

## 2022-03-05 IMAGING — US US EXTREM UP*L* LTD
1 series · 14 of 18 positions shown · non-contrast
Comparison: None.

CLINICAL DATA: Two forearm lesions.

EXAM:
ULTRASOUND left UPPER EXTREMITY LIMITED
TECHNIQUE: Ultrasound examination of the upper extremity soft tissues was
performed in the area of clinical concern.

[Series 1: us extrem up*left* ltd · 0.05mm/px · 18 acquisitions, 14 frames shown]
[im 1/18]
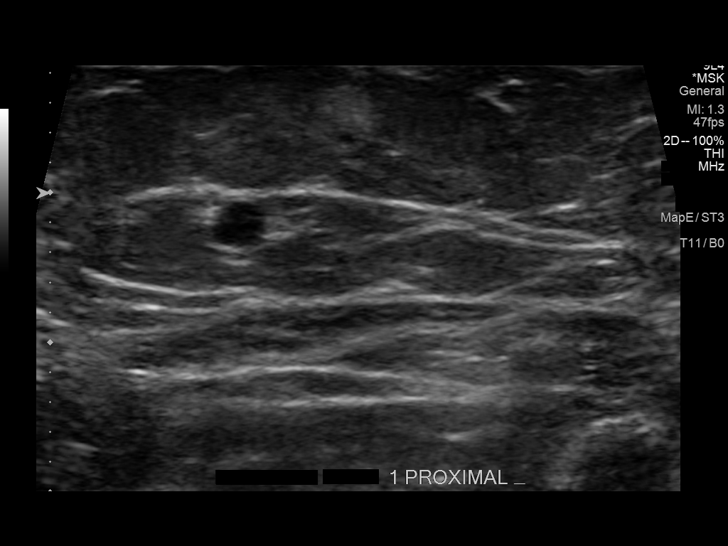
[im 2/18]
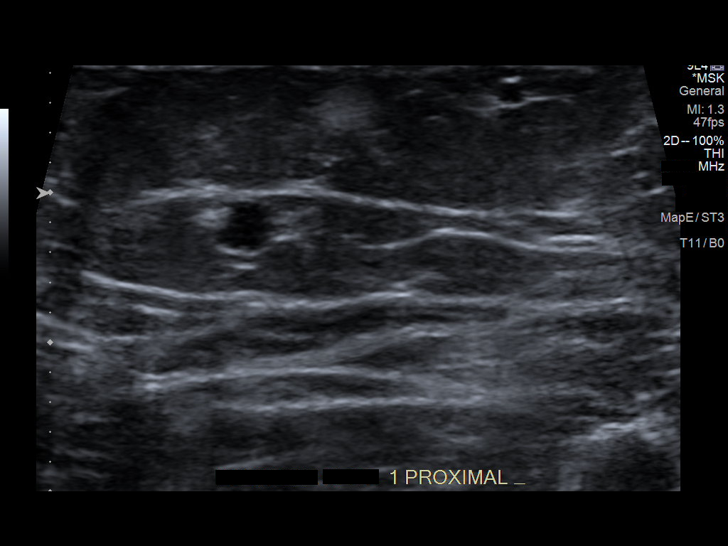
[im 4/18]
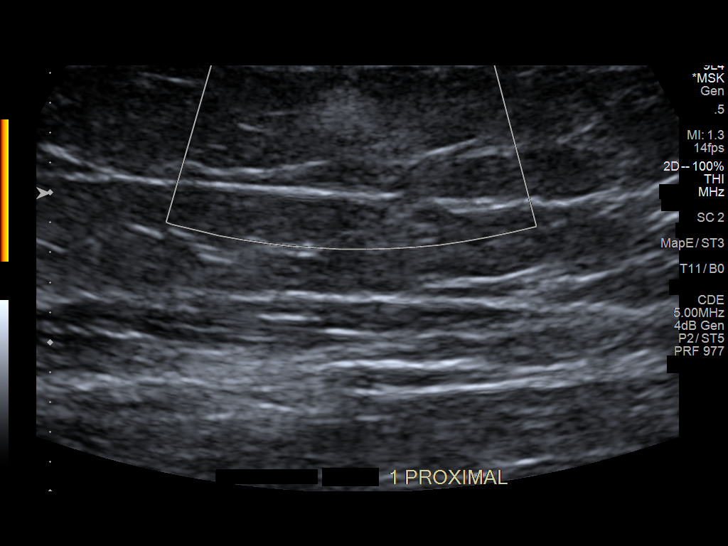
[im 5/18]
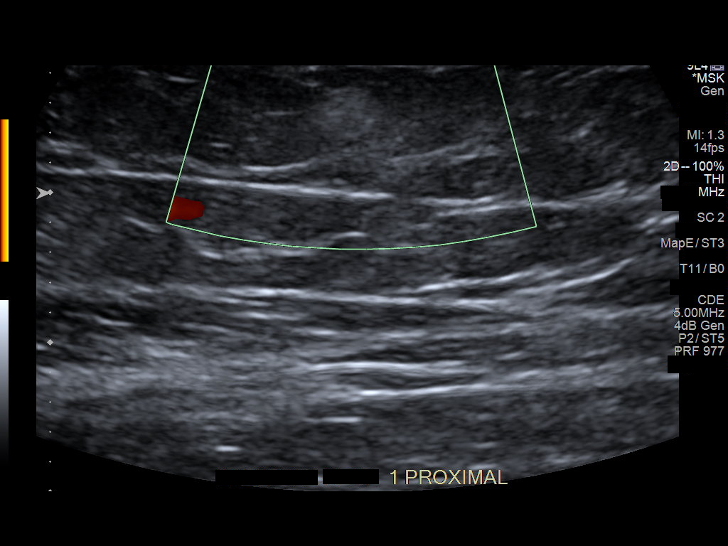
[im 6/18]
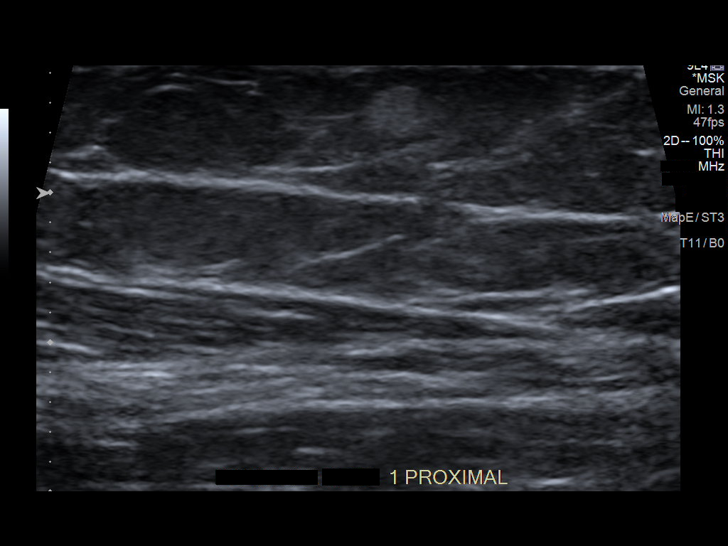
[im 8/18]
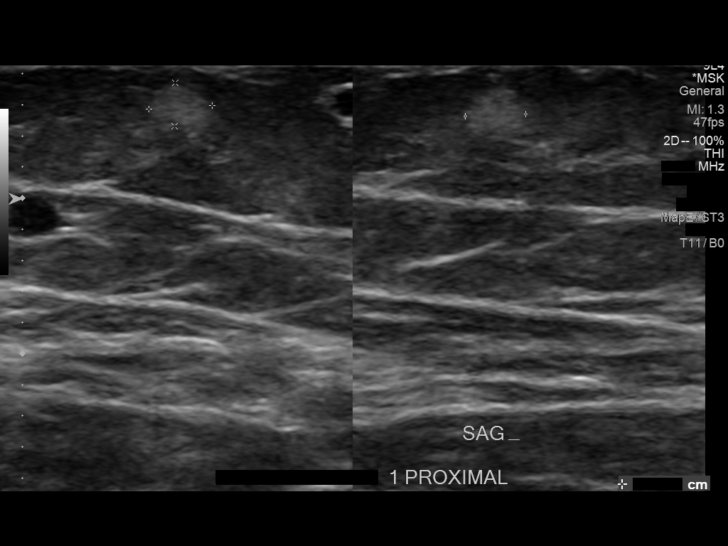
[im 9/18]
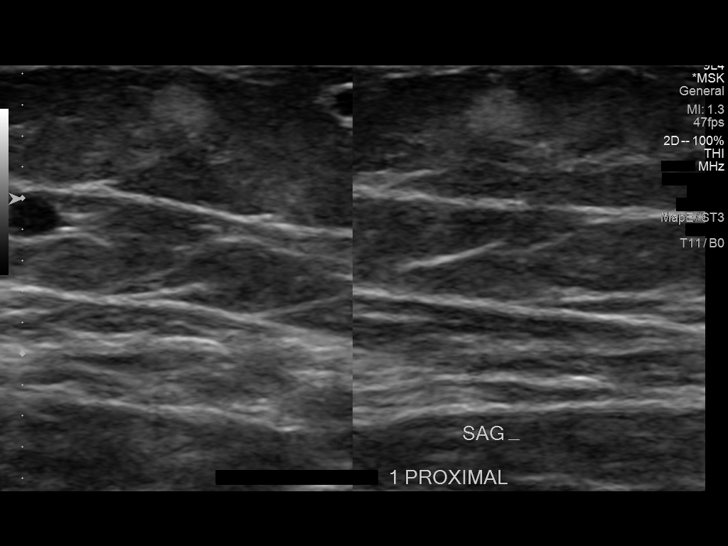
[im 10/18]
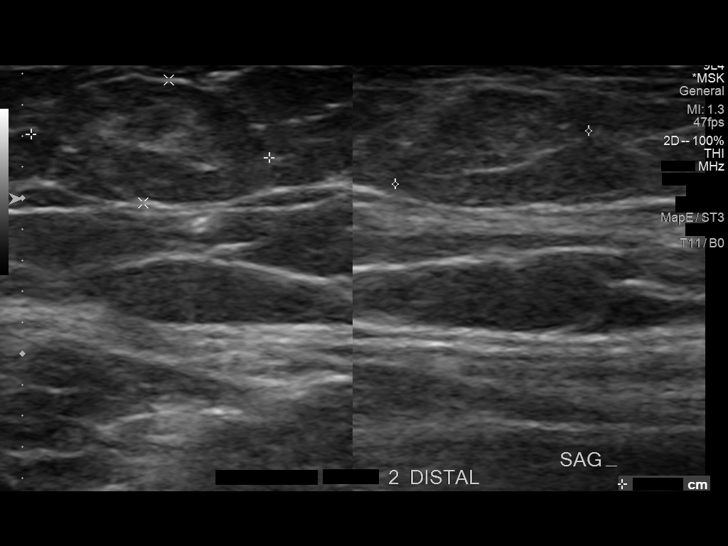
[im 11/18]
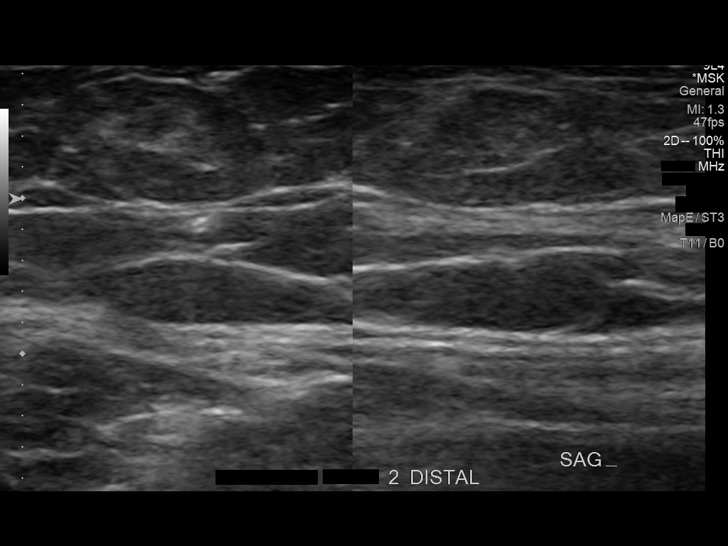
[im 13/18]
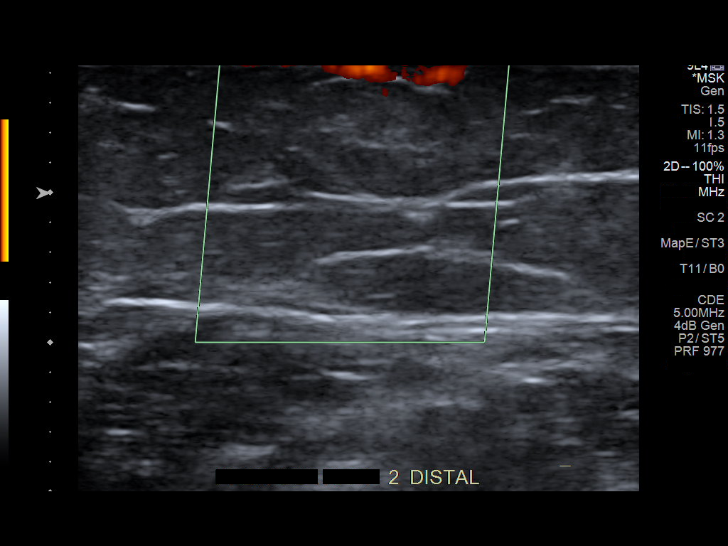
[im 14/18]
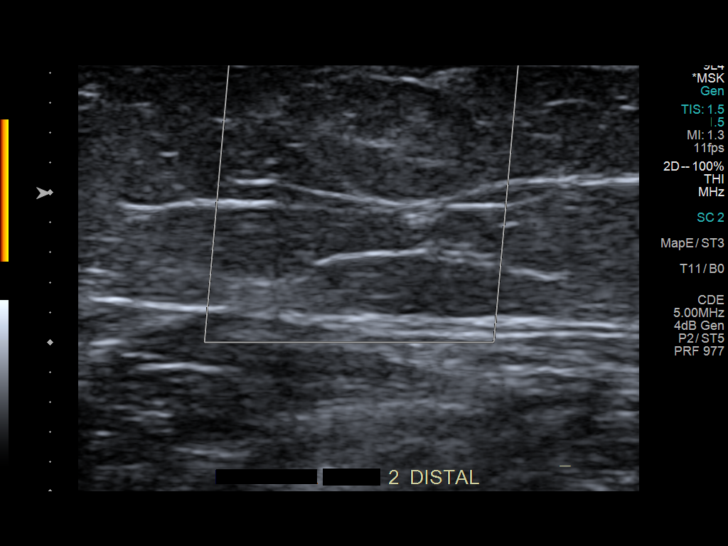
[im 15/18]
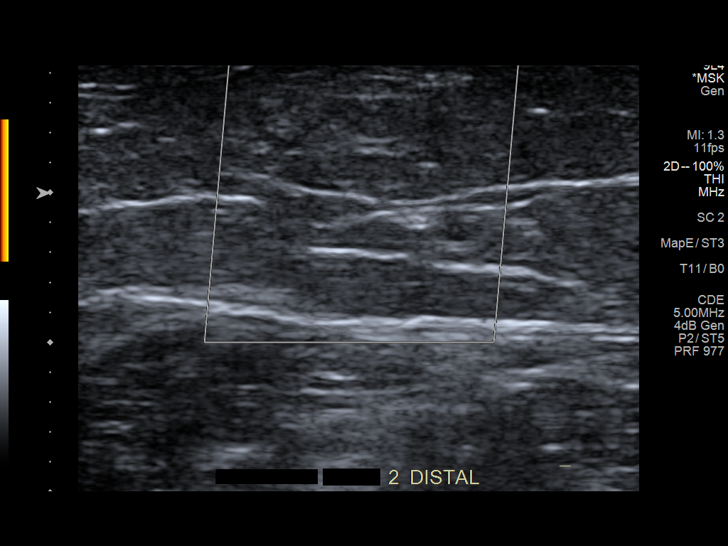
[im 17/18]
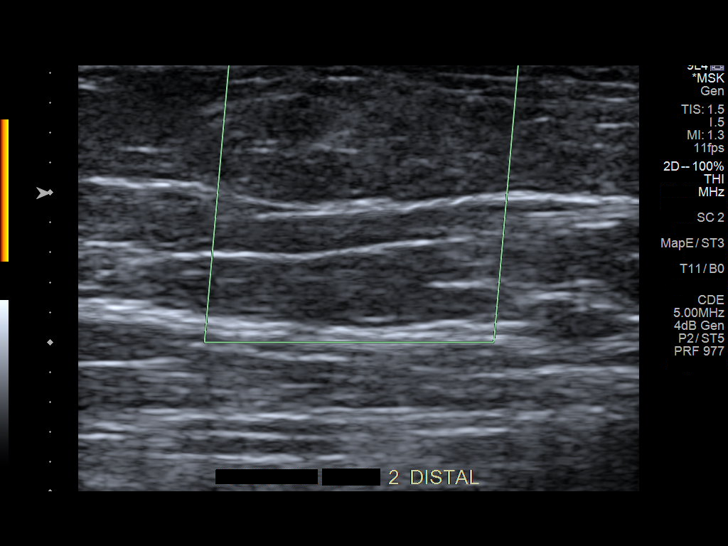
[im 18/18]
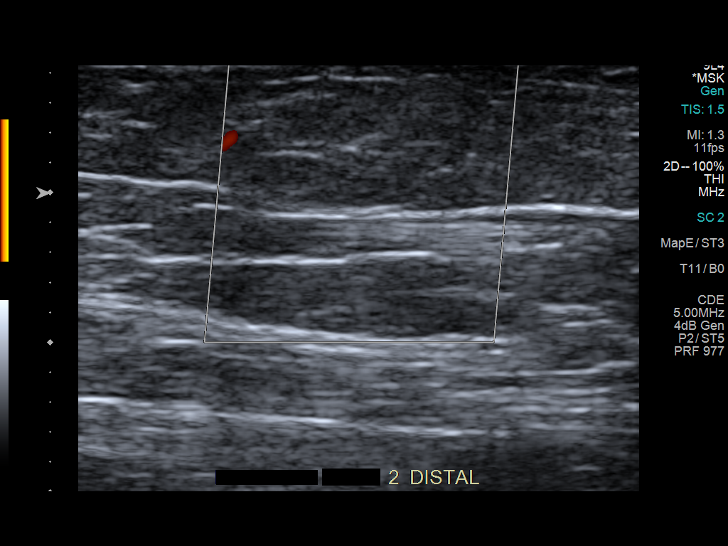

[14 of 18 positions shown; findings below may reference images not displayed]

FINDINGS: The more proximal palpable lesion corresponds to a small
homogeneously echogenic structure measuring approximately 4 x 3 x 4
cm. This is much more echogenic than the surrounding fat and is
unlikely a lipoma.

The second more distal forearm lesion measures 1.5 x 0.8 x 1.3 cm
and has very similar echogenicity as the surrounding subcutaneous
fat. This is most likely a benign lipoma. Neither lesion
demonstrates internal blood flow.
IMPRESSION: Indeterminate 4 mm soft tissue lesion in the subcutaneous fat of the
left forearm. The other more distal lesion appears to be a benign
lipoma. Recommend MR imaging without and with contrast for further
evaluation.

## 2022-03-09 ENCOUNTER — Other Ambulatory Visit: Payer: Self-pay | Admitting: Family Medicine

## 2022-03-09 DIAGNOSIS — R2232 Localized swelling, mass and lump, left upper limb: Secondary | ICD-10-CM

## 2022-03-16 DIAGNOSIS — N946 Dysmenorrhea, unspecified: Secondary | ICD-10-CM | POA: Diagnosis not present

## 2022-03-16 DIAGNOSIS — Z3041 Encounter for surveillance of contraceptive pills: Secondary | ICD-10-CM | POA: Diagnosis not present

## 2022-03-31 ENCOUNTER — Ambulatory Visit
Admission: RE | Admit: 2022-03-31 | Discharge: 2022-03-31 | Disposition: A | Discharge status: Home / Self Care | Payer: BC Managed Care – PPO | Source: Ambulatory Visit | Attending: Family Medicine | Admitting: Family Medicine

## 2022-03-31 ENCOUNTER — Other Ambulatory Visit: Payer: BC Managed Care – PPO

## 2022-03-31 DIAGNOSIS — R2232 Localized swelling, mass and lump, left upper limb: Secondary | ICD-10-CM

## 2022-03-31 IMAGING — MR MR FOREARM*L* WO/W CM
9 series · 40 of 40 positions shown · IV contrast (multihance)
Comparison: None Available.

CLINICAL DATA: Mass in the mid-distal forearm. Symptoms for 1 year.
No known injury.

EXAM:
MRI OF THE LEFT FOREARM WITHOUT AND WITH CONTRAST
TECHNIQUE: Multiplanar, multisequence MR imaging of the left forearm was
performed before and after the administration of intravenous
contrast.
CONTRAST:  20mL MULTIHANCE GADOBENATE DIMEGLUMINE 529 MG/ML IV SOLN

[Series 6: T1 · axial · left · 5.0mm · 0.44mm/px · z∈[+71,+332]mm · 5 of 45 slices shown (1 of 3)]
[im 1/45]
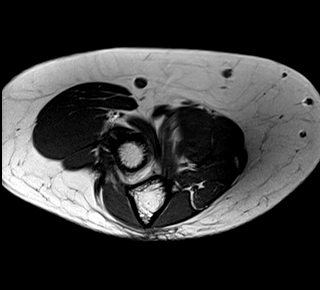
[im 12/45]
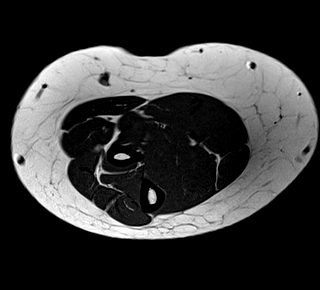
[im 23/45]
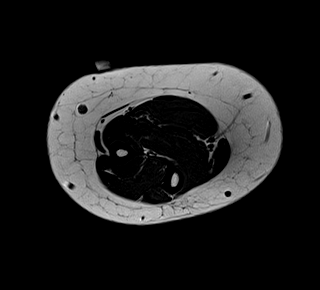
[im 34/45]
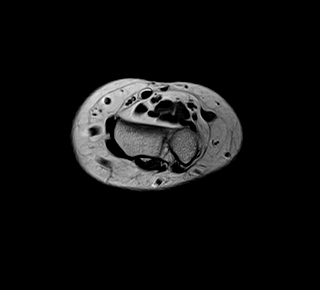
[im 45/45]
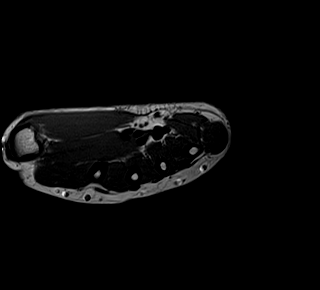

[Series 8: T1 · coronal · left · 4.0mm · 0.83mm/px · 3 of 23 slices shown (2 of 3)]
[im 1/23]
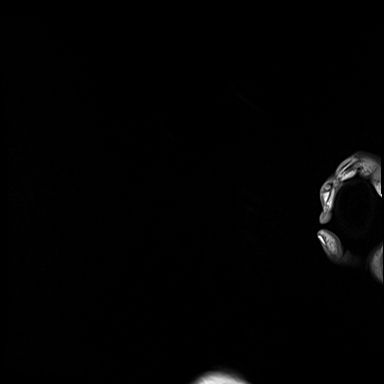
[im 12/23]
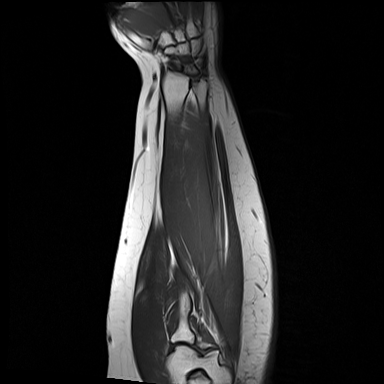
[im 23/23]
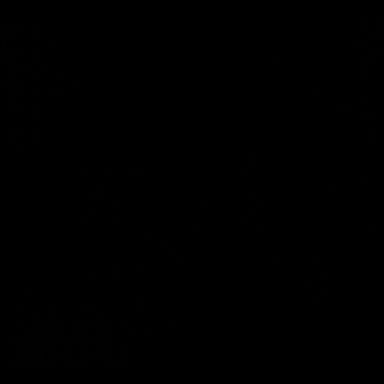

[Series 9: STIR · coronal · left · 4.0mm · 1.00mm/px · 3 of 23 slices shown]
[im 1/23]
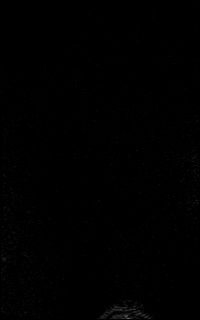
[im 12/23]
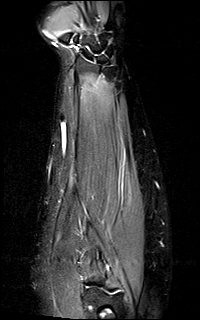
[im 23/23]
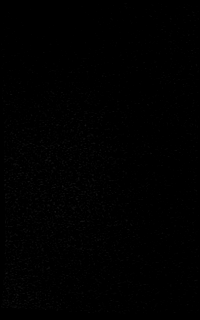

[Series 10: T1 · sagittal · left · 4.0mm · 0.83mm/px · 4 of 33 slices shown (3 of 3)]
[im 1/33]
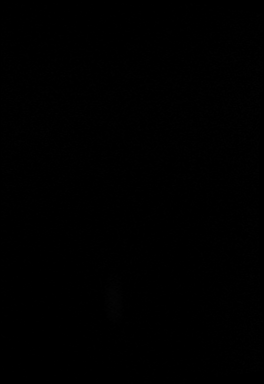
[im 11/33]
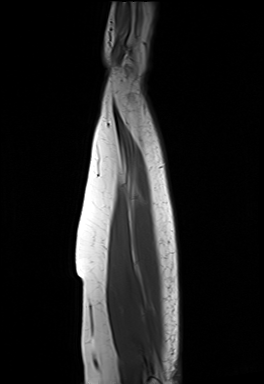
[im 22/33]
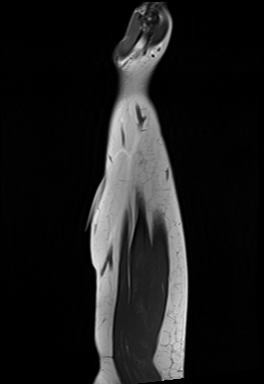
[im 33/33]
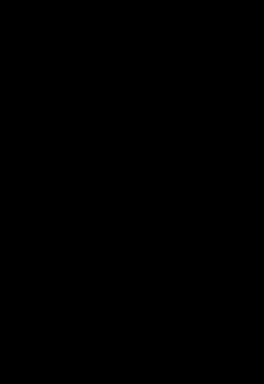

[Series 11: T2 fat-sat · axial · left · 5.0mm · 0.44mm/px · z∈[+71,+332]mm · 6 of 45 slices shown (1 of 2)]
[im 1/45]
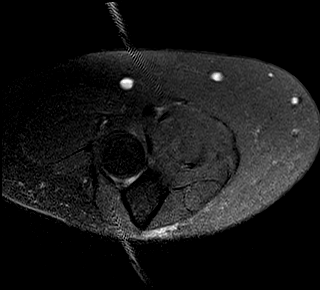
[im 9/45]
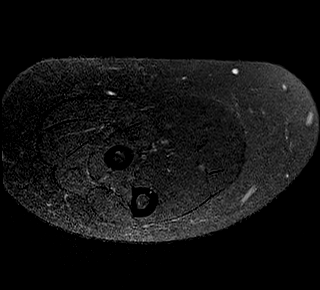
[im 18/45]
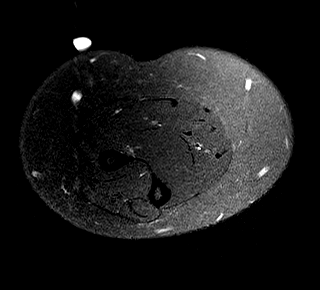
[im 27/45]
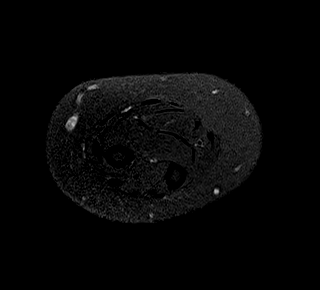
[im 36/45]
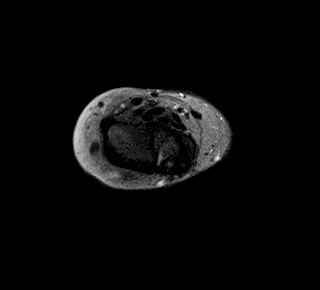
[im 45/45]
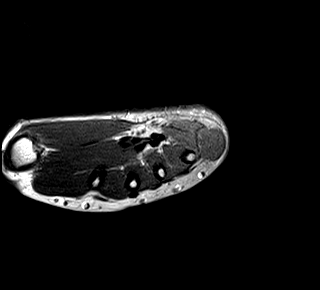

[Series 12: T2 fat-sat · sagittal · left · 4.0mm · 1.00mm/px · 4 of 33 slices shown (2 of 2)]
[im 1/33]
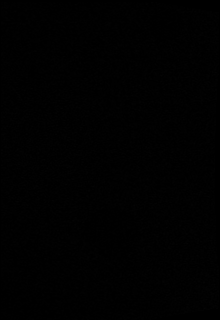
[im 11/33]
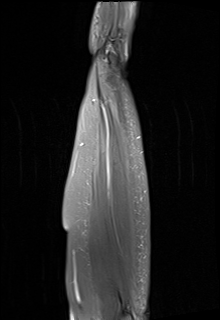
[im 22/33]
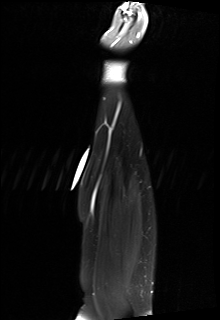
[im 33/33]
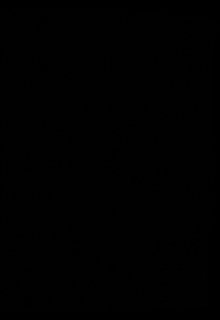

[Series 13: T1 fat-sat · axial · non-contrast · left · 5.0mm · 0.47mm/px · z∈[+71,+332]mm · 6 of 45 slices shown]
[im 1/45]
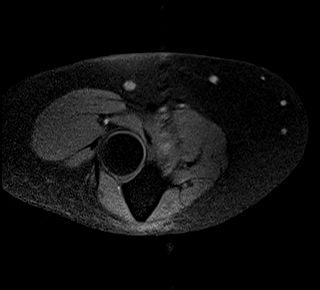
[im 9/45]
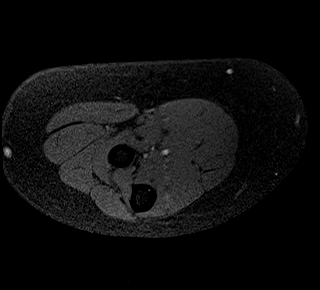
[im 18/45]
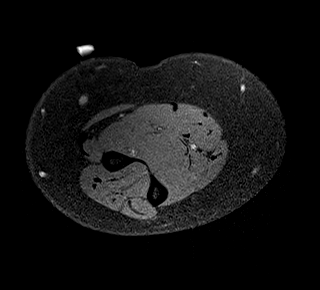
[im 27/45]
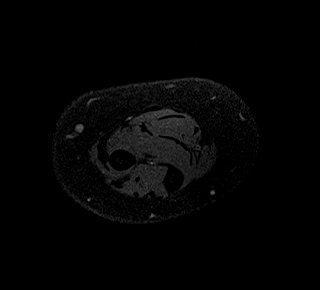
[im 36/45]
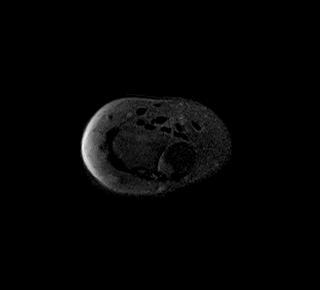
[im 45/45]
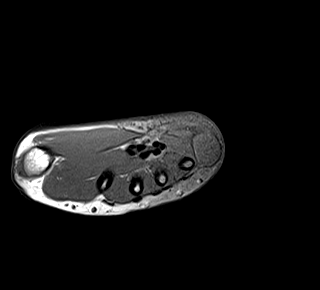

[Series 14: T1 fat-sat post-contrast · coronal · left · 4.0mm · 1.00mm/px · 3 of 23 slices shown (1 of 2)]
[im 1/23]
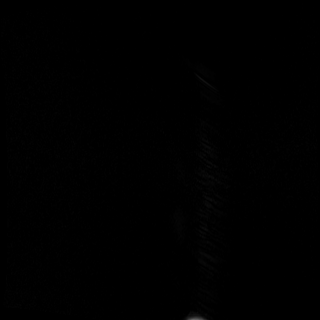
[im 12/23]
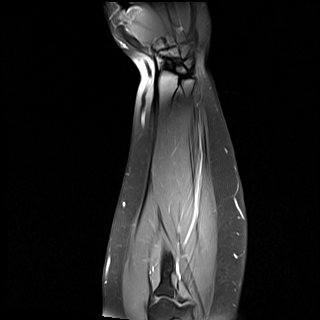
[im 23/23]
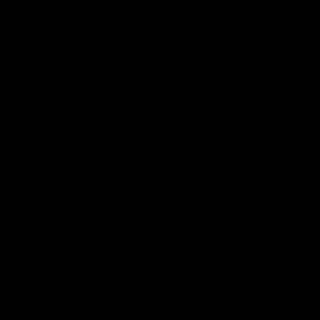

[Series 15: T1 fat-sat post-contrast · axial · left · 5.0mm · 0.47mm/px · z∈[+73,+333]mm · 6 of 45 slices shown (2 of 2)]
[im 1/45]
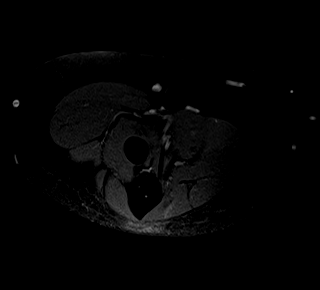
[im 9/45]
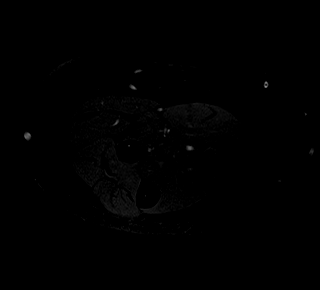
[im 18/45]
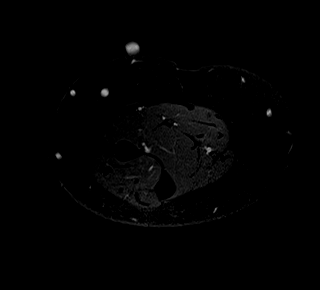
[im 27/45]
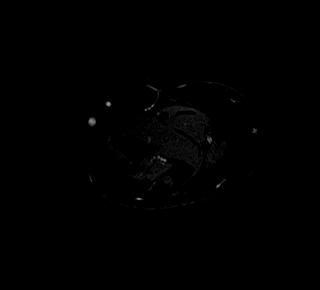
[im 36/45]
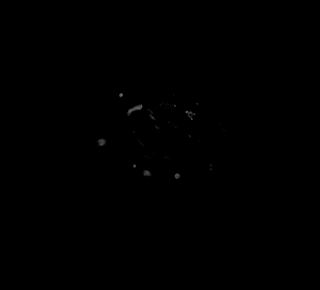
[im 45/45]
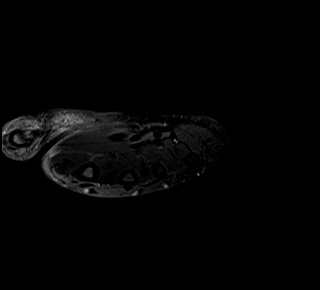

[40 of 40 positions shown; findings below may reference images not displayed]

FINDINGS: Bones/Joint/Cartilage

No marrow signal abnormality. No fracture or dislocation. Normal
alignment. No joint effusion. No erosive changes. No periostitis.

Ligaments

Collateral ligaments are intact.

Muscles and Tendons
Flexor and extensor compartment muscles are intact. Biceps and
triceps tendon insertions are intact. Visualized flexor and extensor
compartment tendons at the level of the distal forearm and wrist are
intact.

Soft tissue
No fluid collection or hematoma.  No soft tissue mass.
IMPRESSION: 1. No acute abnormality of the left forearm.

## 2022-03-31 MED ORDER — GADOBENATE DIMEGLUMINE 529 MG/ML IV SOLN
20.0000 mL | Freq: Once | INTRAVENOUS | Status: AC | PRN
  Administered 2022-03-31: 20 mL via INTRAVENOUS

## 2022-05-03 DIAGNOSIS — M545 Low back pain, unspecified: Secondary | ICD-10-CM | POA: Diagnosis not present

## 2022-05-03 DIAGNOSIS — F419 Anxiety disorder, unspecified: Secondary | ICD-10-CM | POA: Diagnosis not present

## 2022-05-03 DIAGNOSIS — J329 Chronic sinusitis, unspecified: Secondary | ICD-10-CM | POA: Diagnosis not present

## 2022-08-20 DIAGNOSIS — Z113 Encounter for screening for infections with a predominantly sexual mode of transmission: Secondary | ICD-10-CM | POA: Diagnosis not present

## 2022-08-20 DIAGNOSIS — Z124 Encounter for screening for malignant neoplasm of cervix: Secondary | ICD-10-CM | POA: Diagnosis not present

## 2022-08-20 DIAGNOSIS — Z01419 Encounter for gynecological examination (general) (routine) without abnormal findings: Secondary | ICD-10-CM | POA: Diagnosis not present

## 2022-09-11 DIAGNOSIS — J206 Acute bronchitis due to rhinovirus: Secondary | ICD-10-CM | POA: Diagnosis not present

## 2022-11-11 DIAGNOSIS — F419 Anxiety disorder, unspecified: Secondary | ICD-10-CM | POA: Diagnosis not present

## 2023-01-26 DIAGNOSIS — J069 Acute upper respiratory infection, unspecified: Secondary | ICD-10-CM | POA: Diagnosis not present

## 2023-01-26 DIAGNOSIS — R509 Fever, unspecified: Secondary | ICD-10-CM | POA: Diagnosis not present

## 2023-01-26 DIAGNOSIS — R030 Elevated blood-pressure reading, without diagnosis of hypertension: Secondary | ICD-10-CM | POA: Diagnosis not present

## 2023-05-18 DIAGNOSIS — Z0000 Encounter for general adult medical examination without abnormal findings: Secondary | ICD-10-CM | POA: Diagnosis not present

## 2023-05-18 DIAGNOSIS — F419 Anxiety disorder, unspecified: Secondary | ICD-10-CM | POA: Diagnosis not present

## 2023-08-12 DIAGNOSIS — R194 Change in bowel habit: Secondary | ICD-10-CM | POA: Diagnosis not present

## 2023-08-12 DIAGNOSIS — K625 Hemorrhage of anus and rectum: Secondary | ICD-10-CM | POA: Diagnosis not present

## 2023-09-09 ENCOUNTER — Other Ambulatory Visit: Payer: Self-pay | Admitting: Internal Medicine

## 2023-09-20 ENCOUNTER — Encounter (HOSPITAL_COMMUNITY): Payer: Self-pay | Admitting: Internal Medicine

## 2023-09-20 NOTE — Progress Notes (Signed)
Attempted to obtain medical history for pre op call via telephone, unable to reach at this time. HIPAA compliant voicemail message left requesting return call to pre surgical testing department.

## 2023-09-27 ENCOUNTER — Ambulatory Visit (HOSPITAL_COMMUNITY)
Admission: RE | Admit: 2023-09-27 | Payer: BC Managed Care – PPO | Source: Home / Self Care | Admitting: Internal Medicine

## 2023-09-27 SURGERY — COLONOSCOPY WITH PROPOFOL
Anesthesia: Monitor Anesthesia Care

## 2023-09-28 DIAGNOSIS — N9489 Other specified conditions associated with female genital organs and menstrual cycle: Secondary | ICD-10-CM | POA: Diagnosis not present

## 2023-09-28 DIAGNOSIS — R350 Frequency of micturition: Secondary | ICD-10-CM | POA: Diagnosis not present

## 2023-09-28 DIAGNOSIS — Z113 Encounter for screening for infections with a predominantly sexual mode of transmission: Secondary | ICD-10-CM | POA: Diagnosis not present

## 2023-09-28 DIAGNOSIS — N946 Dysmenorrhea, unspecified: Secondary | ICD-10-CM | POA: Diagnosis not present

## 2023-09-28 DIAGNOSIS — Z01419 Encounter for gynecological examination (general) (routine) without abnormal findings: Secondary | ICD-10-CM | POA: Diagnosis not present

## 2023-12-07 DIAGNOSIS — M545 Low back pain, unspecified: Secondary | ICD-10-CM | POA: Diagnosis not present

## 2023-12-07 DIAGNOSIS — N809 Endometriosis, unspecified: Secondary | ICD-10-CM | POA: Diagnosis not present

## 2023-12-07 DIAGNOSIS — F419 Anxiety disorder, unspecified: Secondary | ICD-10-CM | POA: Diagnosis not present

## 2024-01-04 DIAGNOSIS — G4719 Other hypersomnia: Secondary | ICD-10-CM | POA: Diagnosis not present

## 2024-01-09 DIAGNOSIS — R051 Acute cough: Secondary | ICD-10-CM | POA: Diagnosis not present

## 2024-01-09 DIAGNOSIS — R5383 Other fatigue: Secondary | ICD-10-CM | POA: Diagnosis not present

## 2024-01-09 DIAGNOSIS — R52 Pain, unspecified: Secondary | ICD-10-CM | POA: Diagnosis not present

## 2024-01-09 DIAGNOSIS — U071 COVID-19: Secondary | ICD-10-CM | POA: Diagnosis not present

## 2024-01-09 DIAGNOSIS — J029 Acute pharyngitis, unspecified: Secondary | ICD-10-CM | POA: Diagnosis not present

## 2024-01-18 DIAGNOSIS — G4733 Obstructive sleep apnea (adult) (pediatric): Secondary | ICD-10-CM | POA: Diagnosis not present

## 2024-01-23 DIAGNOSIS — G4733 Obstructive sleep apnea (adult) (pediatric): Secondary | ICD-10-CM | POA: Diagnosis not present

## 2024-05-21 DIAGNOSIS — Z0000 Encounter for general adult medical examination without abnormal findings: Secondary | ICD-10-CM | POA: Diagnosis not present

## 2024-05-21 DIAGNOSIS — F419 Anxiety disorder, unspecified: Secondary | ICD-10-CM | POA: Diagnosis not present

## 2024-12-27 ENCOUNTER — Ambulatory Visit
Admission: RE | Admit: 2024-12-27 | Discharge: 2024-12-27 | Disposition: A | Discharge status: Home / Self Care | Source: Ambulatory Visit | Attending: Nurse Practitioner | Admitting: Nurse Practitioner

## 2024-12-27 ENCOUNTER — Other Ambulatory Visit: Payer: Self-pay | Admitting: Nurse Practitioner

## 2024-12-27 DIAGNOSIS — N921 Excessive and frequent menstruation with irregular cycle: Secondary | ICD-10-CM
